# Patient Record
Sex: Female | Born: 2000 | Race: White | Hispanic: No | Marital: Single | Smoking: Never smoker
Health system: Southern US, Community
[De-identification: ages and names within clinical notes are randomized; demographics above are authoritative.]

## PROBLEM LIST (undated history)

## (undated) DIAGNOSIS — L709 Acne, unspecified: Secondary | ICD-10-CM

---
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
## 2020-01-18 ENCOUNTER — Other Ambulatory Visit: Payer: Self-pay | Admitting: Physician Assistant

## 2020-01-18 DIAGNOSIS — N921 Excessive and frequent menstruation with irregular cycle: Secondary | ICD-10-CM

## 2020-04-14 ENCOUNTER — Other Ambulatory Visit: Payer: Self-pay | Admitting: Physician Assistant

## 2020-04-14 DIAGNOSIS — N921 Excessive and frequent menstruation with irregular cycle: Secondary | ICD-10-CM

## 2020-05-12 ENCOUNTER — Other Ambulatory Visit: Payer: Self-pay | Admitting: Physician Assistant

## 2020-05-12 DIAGNOSIS — N921 Excessive and frequent menstruation with irregular cycle: Secondary | ICD-10-CM

## 2020-05-20 ENCOUNTER — Encounter: Payer: Self-pay | Admitting: Medical-Surgical

## 2020-05-20 ENCOUNTER — Telehealth (INDEPENDENT_AMBULATORY_CARE_PROVIDER_SITE_OTHER): Payer: No Typology Code available for payment source | Admitting: Medical-Surgical

## 2020-05-20 DIAGNOSIS — N921 Excessive and frequent menstruation with irregular cycle: Secondary | ICD-10-CM

## 2020-05-20 DIAGNOSIS — Z3041 Encounter for surveillance of contraceptive pills: Secondary | ICD-10-CM

## 2020-05-20 MED ORDER — NORETHIN ACE-ETH ESTRAD-FE 1.5-30 MG-MCG PO TABS: ORAL_TABLET | ORAL | 0 refills | Status: DC

## 2020-05-20 NOTE — Progress Notes (Signed)
Virtual Visit via Video Note  I connected with [employer] on 05/20/20 at 10:10 AM EDT by a video enabled telemedicine application and verified that I am speaking with the correct person using two identifiers.   I discussed the limitations of evaluation and management by telemedicine and the availability of in person appointments. The patient expressed understanding and agreed to proceed.  Patient location: home Provider locations: office  Subjective:    CC: Birth control follow-up  HPI: Pleasant 19 year old female presenting via [place] video visit to discuss birth control.  She was last seen in July 2020 and has an upcoming appointment around Thanksgiving.  She is out of town for school and cannot come sooner.  She has been taking Junel birth control for 4-5 years, tolerating well without side effects.  She is currently on the placebo pills of her most recent refill but then will be out.  She is sexually active with female partners and uses condoms most of the time.  She is up-to-date on her Pap smear which was done by [facility] in August 2021.  Denies fever, chills, chest pain, severe headache, lower extremity pain/erythema/edema, abdominal pain, and vision changes.  Past medical history, Surgical history, Family history not pertinant except as noted below, Social history, Allergies, and medications have been entered into the medical record, reviewed, and corrections made.   Review of Systems: See HPI for pertinent positives and negatives.   Objective:    General: Speaking clearly in complete sentences without any shortness of breath.  Alert and oriented x3.  Normal judgment. No apparent acute distress.  Impression and Recommendations:    1. Surveillance for birth control, oral contraceptives/menorrhagia Currently menstruating and has not missed any pills prior to this appointment.  Continue Junel birth control 1 tablet daily.  61-month supply provided to allow her to establish  with a new PCP when she is in town next month.  Encouraged using condoms with every sexual encounter with female partners. - norethindrone-ethinyl estradiol-iron (JUNEL FE 1.5/30) 1.5-30 MG-MCG tablet; TAKE 1 TABLET BY MOUTH DAILY  Dispense: 84 tablet; Refill: 0  Return if symptoms worsen or fail to improve.  15 minutes of non face-to-face time was provided during this encounter.   I discussed the assessment and treatment plan with the patient. The patient was provided an opportunity to ask questions and all were answered. The patient agreed with the plan and demonstrated an understanding of the instructions.   The patient was advised to call back or seek an in-person evaluation if the symptoms worsen or if the condition fails to improve as anticipated.  Clearnce Sorrel, DNP, APRN, FNP-BC [facility]

## 2020-07-02 ENCOUNTER — Ambulatory Visit (INDEPENDENT_AMBULATORY_CARE_PROVIDER_SITE_OTHER): Payer: No Typology Code available for payment source | Admitting: Physician Assistant

## 2020-07-02 DIAGNOSIS — Z5329 Procedure and treatment not carried out because of patient's decision for other reasons: Secondary | ICD-10-CM

## 2020-07-02 NOTE — Progress Notes (Signed)
No show

## 2020-07-29 ENCOUNTER — Other Ambulatory Visit: Payer: Self-pay | Admitting: Medical-Surgical

## 2020-07-29 DIAGNOSIS — N921 Excessive and frequent menstruation with irregular cycle: Secondary | ICD-10-CM

## 2020-08-07 ENCOUNTER — Encounter: Payer: Self-pay | Admitting: Physician Assistant

## 2020-08-07 ENCOUNTER — Other Ambulatory Visit: Payer: Self-pay

## 2020-08-07 ENCOUNTER — Ambulatory Visit (INDEPENDENT_AMBULATORY_CARE_PROVIDER_SITE_OTHER): Payer: No Typology Code available for payment source | Admitting: Physician Assistant

## 2020-08-07 VITALS — BP 129/89 | HR 113 | Wt 151.0 lb

## 2020-08-07 DIAGNOSIS — Z3041 Encounter for surveillance of contraceptive pills: Secondary | ICD-10-CM

## 2020-08-07 DIAGNOSIS — N921 Excessive and frequent menstruation with irregular cycle: Secondary | ICD-10-CM

## 2020-08-07 MED ORDER — NORETHIN ACE-ETH ESTRAD-FE 1.5-30 MG-MCG PO TABS: ORAL_TABLET | ORAL | 4 refills | Status: DC

## 2020-08-07 NOTE — Patient Instructions (Signed)
Health Maintenance, Female Adopting a healthy lifestyle and getting preventive care are important in promoting health and wellness. Ask your health care provider about:  The right schedule for you to have regular tests and exams.  Things you can do on your own to prevent diseases and keep yourself healthy. What should I know about diet, weight, and exercise? Eat a healthy diet   Eat a diet that includes plenty of vegetables, fruits, low-fat dairy products, and lean protein.  Do not eat a lot of foods that are high in solid fats, added sugars, or sodium. Maintain a healthy weight Body mass index (BMI) is used to identify weight problems. It estimates body fat based on height and weight. Your health care provider can help determine your BMI and help you achieve or maintain a healthy weight. Get regular exercise Get regular exercise. This is one of the most important things you can do for your health. Most adults should:  Exercise for at least 150 minutes each week. The exercise should increase your heart rate and make you sweat (moderate-intensity exercise).  Do strengthening exercises at least twice a week. This is in addition to the moderate-intensity exercise.  Spend less time sitting. Even light physical activity can be beneficial. Watch cholesterol and blood lipids Have your blood tested for lipids and cholesterol at 20 years of age, then have this test every 5 years. Have your cholesterol levels checked more often if:  Your lipid or cholesterol levels are high.  You are older than 19 years of age.  You are at high risk for heart disease. What should I know about cancer screening? Depending on your health history and family history, you may need to have cancer screening at various ages. This may include screening for:  Breast cancer.  Cervical cancer.  Colorectal cancer.  Skin cancer.  Lung cancer. What should I know about heart disease, diabetes, and high blood  pressure? Blood pressure and heart disease  High blood pressure causes heart disease and increases the risk of stroke. This is more likely to develop in people who have high blood pressure readings, are of African descent, or are overweight.  Have your blood pressure checked: ? Every 3-5 years if you are 18-39 years of age. ? Every year if you are 40 years old or older. Diabetes Have regular diabetes screenings. This checks your fasting blood sugar level. Have the screening done:  Once every three years after age 40 if you are at a normal weight and have a low risk for diabetes.  More often and at a younger age if you are overweight or have a high risk for diabetes. What should I know about preventing infection? Hepatitis B If you have a higher risk for hepatitis B, you should be screened for this virus. Talk with your health care provider to find out if you are at risk for hepatitis B infection. Hepatitis C Testing is recommended for:  Everyone born from 1945 through 1965.  Anyone with known risk factors for hepatitis C. Sexually transmitted infections (STIs)  Get screened for STIs, including gonorrhea and chlamydia, if: ? You are sexually active and are younger than 19 years of age. ? You are older than 19 years of age and your health care provider tells you that you are at risk for this type of infection. ? Your sexual activity has changed since you were last screened, and you are at increased risk for chlamydia or gonorrhea. Ask your health care provider if   you are at risk.  Ask your health care provider about whether you are at high risk for HIV. Your health care provider may recommend a prescription medicine to help prevent HIV infection. If you choose to take medicine to prevent HIV, you should first get tested for HIV. You should then be tested every 3 months for as long as you are taking the medicine. Pregnancy  If you are about to stop having your period (premenopausal) and  you may become pregnant, seek counseling before you get pregnant.  Take 400 to 800 micrograms (mcg) of folic acid every day if you become pregnant.  Ask for birth control (contraception) if you want to prevent pregnancy. Osteoporosis and menopause Osteoporosis is a disease in which the bones lose minerals and strength with aging. This can result in bone fractures. If you are 65 years old or older, or if you are at risk for osteoporosis and fractures, ask your health care provider if you should:  Be screened for bone loss.  Take a calcium or vitamin D supplement to lower your risk of fractures.  Be given hormone replacement therapy (HRT) to treat symptoms of menopause. Follow these instructions at home: Lifestyle  Do not use any products that contain nicotine or tobacco, such as cigarettes, e-cigarettes, and chewing tobacco. If you need help quitting, ask your health care provider.  Do not use street drugs.  Do not share needles.  Ask your health care provider for help if you need support or information about quitting drugs. Alcohol use  Do not drink alcohol if: ? Your health care provider tells you not to drink. ? You are pregnant, may be pregnant, or are planning to become pregnant.  If you drink alcohol: ? Limit how much you use to 0-1 drink a day. ? Limit intake if you are breastfeeding.  Be aware of how much alcohol is in your drink. In the [place] one drink equals one 12 oz bottle of beer (355 mL), one 5 oz glass of wine (148 mL), or one 1 oz glass of hard liquor (44 mL). General instructions  Schedule regular health, dental, and eye exams.  Stay current with your vaccines.  Tell your health care provider if: ? You often feel depressed. ? You have ever been abused or do not feel safe at home. Summary  Adopting a healthy lifestyle and getting preventive care are important in promoting health and wellness.  Follow your health care provider's instructions about healthy  diet, exercising, and getting tested or screened for diseases.  Follow your health care provider's instructions on monitoring your cholesterol and blood pressure. This information is not intended to replace advice given to you by your health care provider. Make sure you discuss any questions you have with your health care provider. Document Revised: 07/20/2018 Document Reviewed: 07/20/2018 Elsevier Patient Education  2020 [employer]  

## 2020-08-07 NOTE — Progress Notes (Signed)
   Subjective:    Patient ID: Margaret Bray, female    DOB: 2000/11/09, 19 y.o.   MRN: [id number]  HPI  Pt is a 19 yo female who presents to the clinic for birth control refills.   Pt is doing well with no concerns. Pt is sexually active with one partner. No vaginal discharge or issues. Declines STI testing. She did have pap and STI testing [facility] august [facility].   .. Active Ambulatory Problems    Diagnosis Date Noted  . Acne vulgaris 03/27/2014  . Menorrhagia with irregular cycle 08/31/2016  . Breast pain in female 11/18/2017  . Large breasts 11/18/2017  . Dense breasts 11/19/2017   Resolved Ambulatory Problems    Diagnosis Date Noted  . LESION, LIP 07/01/2010  . Sprain of ankle 11/12/2014  . Bilateral pneumonia 07/09/2015  . Sprain of anterior talofibular ligament of left ankle 06/02/2016   Past Medical History:  Diagnosis Date  . Acne       Review of Systems  All other systems reviewed and are negative.      Objective:   Physical Exam Vitals reviewed.  Constitutional:      Appearance: Normal appearance.  Cardiovascular:     Rate and Rhythm: Normal rate and regular rhythm.     Pulses: Normal pulses.     Heart sounds: Normal heart sounds.  Pulmonary:     Effort: Pulmonary effort is normal.     Breath sounds: Normal breath sounds.  Neurological:     General: No focal deficit present.     Mental Status: She is alert and oriented to person, place, and time.  Psychiatric:        Mood and Affect: Mood normal.        Behavior: Behavior normal.           Assessment & Plan:  Marland KitchenMarland KitchenCrista was seen today for medication refill.  Diagnoses and all orders for this visit:  Encounter for surveillance of contraceptive pills  Menorrhagia with irregular cycle -     norethindrone-ethinyl estradiol-iron (JUNEL FE 1.5/30) 1.5-30 MG-MCG tablet; TAKE 1 TABLET BY MOUTH DAILY   .. Discussed 150 minutes of exercise a week.  Encouraged vitamin D 1000  units and Calcium 1300mg  or 4 servings of dairy a day.  Labs next year.  Pap at 21.  Declines STI testing. Discussed safe sex.  Refilled OcP for 1 year.  covid vaccine UTD.  Declines flu shot.

## 2021-09-22 ENCOUNTER — Other Ambulatory Visit: Payer: Self-pay | Admitting: Physician Assistant

## 2021-09-22 DIAGNOSIS — N921 Excessive and frequent menstruation with irregular cycle: Secondary | ICD-10-CM

## 2021-12-09 ENCOUNTER — Other Ambulatory Visit: Payer: Self-pay | Admitting: Physician Assistant

## 2021-12-09 DIAGNOSIS — N921 Excessive and frequent menstruation with irregular cycle: Secondary | ICD-10-CM

## 2022-01-11 ENCOUNTER — Other Ambulatory Visit: Payer: Self-pay | Admitting: Physician Assistant

## 2022-01-11 DIAGNOSIS — N921 Excessive and frequent menstruation with irregular cycle: Secondary | ICD-10-CM

## 2022-01-26 ENCOUNTER — Other Ambulatory Visit: Payer: Self-pay | Admitting: Physician Assistant

## 2022-01-26 DIAGNOSIS — N921 Excessive and frequent menstruation with irregular cycle: Secondary | ICD-10-CM

## 2022-02-15 ENCOUNTER — Other Ambulatory Visit: Payer: Self-pay | Admitting: Physician Assistant

## 2022-02-15 DIAGNOSIS — N921 Excessive and frequent menstruation with irregular cycle: Secondary | ICD-10-CM

## 2022-03-08 ENCOUNTER — Other Ambulatory Visit: Payer: Self-pay | Admitting: Physician Assistant

## 2022-03-08 DIAGNOSIS — N921 Excessive and frequent menstruation with irregular cycle: Secondary | ICD-10-CM
